# Patient Record
Sex: Male | Born: 2000 | Race: White | Hispanic: No | Marital: Single | Smoking: Never smoker
Health system: Southern US, Community
[De-identification: ages and names within clinical notes are randomized; demographics above are authoritative.]

## PROBLEM LIST (undated history)

## (undated) DIAGNOSIS — T753XXA Motion sickness, initial encounter: Secondary | ICD-10-CM

## (undated) DIAGNOSIS — J45909 Unspecified asthma, uncomplicated: Secondary | ICD-10-CM

## (undated) DIAGNOSIS — R011 Cardiac murmur, unspecified: Secondary | ICD-10-CM

## (undated) HISTORY — DX: Unspecified asthma, uncomplicated: J45.909

## (undated) HISTORY — PX: NO PAST SURGERIES: SHX2092

---
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
## 2019-01-04 ENCOUNTER — Other Ambulatory Visit: Payer: Self-pay

## 2019-01-04 ENCOUNTER — Emergency Department: Payer: No Typology Code available for payment source | Attending: Emergency Medicine

## 2019-01-04 ENCOUNTER — Emergency Department
Admission: EM | Admit: 2019-01-04 | Discharge: 2019-01-05 | Disposition: A | Discharge status: Home / Self Care | Payer: No Typology Code available for payment source | Attending: Emergency Medicine | Admitting: Emergency Medicine

## 2019-01-04 DIAGNOSIS — S0993XA Unspecified injury of face, initial encounter: Secondary | ICD-10-CM | POA: Diagnosis present

## 2019-01-04 DIAGNOSIS — Y9259 Other trade areas as the place of occurrence of the external cause: Secondary | ICD-10-CM | POA: Diagnosis not present

## 2019-01-04 DIAGNOSIS — S0033XA Contusion of nose, initial encounter: Secondary | ICD-10-CM | POA: Diagnosis not present

## 2019-01-04 DIAGNOSIS — Y990 Civilian activity done for income or pay: Secondary | ICD-10-CM | POA: Diagnosis not present

## 2019-01-04 DIAGNOSIS — J45909 Unspecified asthma, uncomplicated: Secondary | ICD-10-CM | POA: Insufficient documentation

## 2019-01-04 DIAGNOSIS — Y9389 Activity, other specified: Secondary | ICD-10-CM | POA: Diagnosis not present

## 2019-01-04 DIAGNOSIS — W208XXA Other cause of strike by thrown, projected or falling object, initial encounter: Secondary | ICD-10-CM | POA: Insufficient documentation

## 2019-01-04 NOTE — Discharge Instructions (Signed)
Follow-up with Dr. Richardson Landry who is the ENT specialist on call today if you continue to have problems.  Ice to your nose when possible to help reduce swelling.  You may take Tylenol or ibuprofen as needed for pain.  X-rays today did not show a nasal bone fracture.

## 2019-01-04 NOTE — ED Provider Notes (Signed)
[facility] Emergency Department Provider Note  ____________________________________________   First MD Initiated Contact with Patient 01/04/19 1041     (approximate)  I have reviewed the triage vital signs and the nursing notes.   HISTORY  Chief Complaint Facial Injury   HPI Derek Ruiz is a 18 y.o. male Derek Ruiz to the ED with complaint of nasal bone pain.  Patient states he was unloading a truck yesterday when a box fell and hit him in the nose.  Patient states that he did have a nosebleed that was controlled.  Patient states he went to urgent care for concerns of a nasal fracture and was sent to the emergency department.  He denies any head injury, loss of consciousness, dizziness or nausea or vomiting.  He rates his pain as a 4 out of 10.       Past Medical History:  Diagnosis Date  . Asthma     Patient Active Problem List   Diagnosis Date Noted  . Hyperlipidemia 08/10/2016  . 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 Dermatitis, eczematoid 05/30/2015  . Obesity 05/30/2015  . Allergic rhinitis 08/23/2006    Past Surgical History:  Procedure Laterality Date  . NO PAST SURGERIES      Prior to Admission medications   Medication Sig Start Date End Date Taking? Authorizing Provider  ADVAIR DISKUS 250-50 MCG/DOSE AEPB INHALE 1 PUFF 2 TIMES DAILY 04/07/16  Yes [provider]  albuterol (PROVENTIL HFA;VENTOLIN HFA) 108 (90 Base) MCG/ACT inhaler Inhale 2 puffs into the lungs every 6 (six) hours as needed for wheezing or shortness of breath. 04/21/16  Yes Chauvin, Molly Maduro, PA  [employer] NASAL DECONGESTANT PE 10 MG TABS tablet TAKE 1 TABLET BY MOUTH EVERY 6 HOURS FOR 5 DAYS 04/07/16  Yes [provider]  Fluticasone Propionate, Inhal, 100 MCG/BLIST AEPB One inhalation twice daily 06/05/15  Yes Chauvin, Molly Maduro, PA  naproxen (NAPROSYN) 500 MG tablet Take 500 mg by mouth 2 (two) times daily. 04/07/16   [provider]     Allergies Patient has no known allergies.  Family History  Problem Relation Age of Onset  . Diabetes Mother   . Congestive Heart Failure Father   . Epilepsy Sister     Social History Social History   Tobacco Use  . Smoking status: Never Smoker  . Smokeless tobacco: Never Used  Substance Use Topics  . Alcohol use: No    Alcohol/week: 0.0 standard drinks  . Drug use: No    Review of Systems Constitutional: No fever/chills Eyes: No visual changes. ENT: Positive nasal bone pain with nosebleed. Cardiovascular: Denies chest pain. Respiratory: Denies shortness of breath. Gastrointestinal:  No nausea, no vomiting.  Musculoskeletal: Negative for back pain. Skin: Negative for rash. Neurological: Negative for headaches ____________________________________________   PHYSICAL EXAM:  VITAL SIGNS: ED Triage Vitals  Enc Vitals Group     BP 01/04/19 0954 136/72     Pulse Rate 01/04/19 0954 81     Resp 01/04/19 0954 17     Temp 01/04/19 0954 98 F (36.7 C)     Temp Source 01/04/19 0954 Oral     SpO2 01/04/19 0954 100 %     Weight 01/04/19 0955 240 lb (108.9 kg)     Height 01/04/19 0955 5\' 7"  (1.702 m)     Head Circumference --      Peak Flow --      Pain Score 01/04/19 0954 4     Pain Loc --  Pain Edu? --      Excl. in [place]? --    Constitutional: Alert and oriented. Well appearing and in no acute distress. Eyes: Conjunctivae are normal. PERRL. EOMI. Head: Atraumatic. Nose: No congestion/rhinnorhea.  No active bleeding noted.  There is some swelling of the distal nasal soft tissue.  Moderate tenderness to palpation.  Skin is intact. Neck: No stridor.  Nontender cervical spine to palpation posteriorly. Cardiovascular: Normal rate, regular rhythm. Grossly normal heart sounds.  Good peripheral circulation. Respiratory: Normal respiratory effort.  No retractions. Lungs CTAB. Musculoskeletal: Moves upper and lower extremities with any difficulty.  Normal gait was  noted. Neurologic:  Normal speech and language. No gross focal neurologic deficits are appreciated. No gait instability. Skin:  Skin is warm, dry and intact.  No discoloration noted, nose. Psychiatric: Mood and affect are normal. Speech and behavior are normal.  ____________________________________________   LABS (all labs ordered are listed, but only abnormal results are displayed)  Labs Reviewed - No data to display  RADIOLOGY  Official radiology report(s): DG Nasal Bones  Result Date: 01/04/2019 CLINICAL DATA:  Posttraumatic nose pain related injury last night EXAM: NASAL BONES - 3+ VIEW COMPARISON:  None. FINDINGS: There is no evidence of fracture or other bone abnormality. The nasal septum is midline. The paranasal sinuses are symmetrically aerated. IMPRESSION: Negative. Electronically Signed   By: Monte Fantasia M.D.   On: 01/04/2019 11:20    ____________________________________________   PROCEDURES  Procedure(s) performed (including Critical Care):  Procedures   ____________________________________________   INITIAL IMPRESSION / ASSESSMENT AND PLAN / ED COURSE  As part of my medical decision making, I reviewed the following data within the electronic MEDICAL RECORD NUMBER Notes from prior ED visits and [place] Controlled Substance Database  18 year old male presents to the ED with a Workmen's Comp. injury.  Patient states that a box fell while he was unloading a truck yesterday.  He denies any LOC, dizziness or visual changes.  Patient states that he had a nosebleed that lasted a short period of time.  X-rays were negative for nasal fracture.  Patient was instructed to use ice to reduce the swelling and if any continued problems he is to follow-up with Dr. Richardson Landry who is the ENT on call today.  Patient will take ibuprofen or Tylenol as needed for pain.  ____________________________________________   FINAL CLINICAL IMPRESSION(S) / ED DIAGNOSES  Final diagnoses:  Contusion  of nose, initial encounter     ED Discharge Orders    None       Note:  This document was prepared using Dragon voice recognition software and may include unintentional dictation errors.    Johnn Hai, PA-C 01/04/19 1256    Carrie Mew, MD 01/04/19 [id number]

## 2019-01-04 NOTE — ED Notes (Signed)
Says last night at 8pm he was working inside a [employer].  Says he was getting box off top shelf about 60 lb.  It fell and hit him in face.  No loc, but hisnose bled.  When it stopped he continued working.  Today it is more swollen and painful.

## 2019-01-04 NOTE — ED Triage Notes (Signed)
Pt works at [employer] and was unloading a truck and a box fell and hit him on the nose and the pt was sent from urgent care for concern of a nasal fx, no noted deformity on arrival

## 2019-01-19 ENCOUNTER — Other Ambulatory Visit: Payer: Self-pay

## 2019-01-19 ENCOUNTER — Encounter: Payer: Self-pay | Admitting: Otolaryngology

## 2019-01-20 ENCOUNTER — Other Ambulatory Visit
Admission: RE | Admit: 2019-01-20 | Discharge: 2019-01-20 | Disposition: A | Discharge status: Home / Self Care | Payer: HRSA Program | Source: Ambulatory Visit | Attending: Otolaryngology | Admitting: Otolaryngology

## 2019-01-20 DIAGNOSIS — Z20822 Contact with and (suspected) exposure to covid-19: Secondary | ICD-10-CM | POA: Diagnosis not present

## 2019-01-20 DIAGNOSIS — Z01812 Encounter for preprocedural laboratory examination: Secondary | ICD-10-CM | POA: Insufficient documentation

## 2019-01-20 NOTE — Anesthesia Preprocedure Evaluation (Addendum)
Anesthesia Evaluation  Patient identified by MRN, date of birth, ID band Patient awake    Reviewed: Allergy & Precautions, NPO status , Patient's Chart, lab work & pertinent test results  History of Anesthesia Complications Negative for: history of anesthetic complications  Airway Mallampati: III  TM Distance: >3 FB Neck ROM: Full    Dental   Pulmonary asthma ,    breath sounds clear to auscultation       Cardiovascular (-) angina(-) DOE  Rhythm:Regular Rate:Normal     Neuro/Psych  Motion sickness    GI/Hepatic neg GERD  ,  Endo/Other    Renal/GU      Musculoskeletal   Abdominal (+) + obese (BMI 48),   Peds  Hematology   Anesthesia Other Findings   Reproductive/Obstetrics                            Anesthesia Physical Anesthesia Plan  ASA: III  Anesthesia Plan: General   Post-op Pain Management:    Induction: Intravenous  PONV Risk Score and Plan: 2 and Ondansetron, Dexamethasone, Scopolamine patch - Pre-op, Treatment may vary due to age or medical condition and Midazolam  Airway Management Planned: Oral ETT  Additional Equipment:   Intra-op Plan:   Post-operative Plan: Extubation in OR  Informed Consent: I have reviewed the patients History and Physical, chart, labs and discussed the procedure including the risks, benefits and alternatives for the proposed anesthesia with the patient or authorized representative who has indicated his/her understanding and acceptance.       Plan Discussed with: CRNA and Anesthesiologist  Anesthesia Plan Comments:         Anesthesia Quick Evaluation

## 2019-01-21 LAB — SARS CORONAVIRUS 2 (TAT 6-24 HRS): SARS Coronavirus 2: NEGATIVE

## 2019-01-24 ENCOUNTER — Ambulatory Visit
Admission: RE | Admit: 2019-01-24 | Discharge: 2019-01-24 | Disposition: A | Discharge status: Home / Self Care | Payer: No Typology Code available for payment source | Attending: Otolaryngology | Admitting: Otolaryngology

## 2019-01-24 ENCOUNTER — Ambulatory Visit: Payer: No Typology Code available for payment source | Admitting: Anesthesiology

## 2019-01-24 ENCOUNTER — Encounter: Payer: Self-pay | Admitting: Otolaryngology

## 2019-01-24 ENCOUNTER — Encounter
Admission: RE | Disposition: A | Discharge status: Home / Self Care | Payer: Self-pay | Source: Home / Self Care | Attending: Otolaryngology

## 2019-01-24 ENCOUNTER — Other Ambulatory Visit: Payer: Self-pay

## 2019-01-24 DIAGNOSIS — Z79899 Other long term (current) drug therapy: Secondary | ICD-10-CM | POA: Insufficient documentation

## 2019-01-24 DIAGNOSIS — Z68.41 Body mass index (BMI) pediatric, greater than or equal to 95th percentile for age: Secondary | ICD-10-CM | POA: Diagnosis not present

## 2019-01-24 DIAGNOSIS — E669 Obesity, unspecified: Secondary | ICD-10-CM | POA: Diagnosis not present

## 2019-01-24 DIAGNOSIS — J342 Deviated nasal septum: Secondary | ICD-10-CM | POA: Diagnosis not present

## 2019-01-24 DIAGNOSIS — J3489 Other specified disorders of nose and nasal sinuses: Secondary | ICD-10-CM | POA: Insufficient documentation

## 2019-01-24 HISTORY — DX: Cardiac murmur, unspecified: R01.1

## 2019-01-24 HISTORY — PX: SEPTOPLASTY: SHX2393

## 2019-01-24 HISTORY — DX: Motion sickness, initial encounter: T75.3XXA

## 2019-01-24 SURGERY — SEPTOPLASTY, NOSE
Anesthesia: General | Site: Nose

## 2019-01-24 MED ORDER — MEPERIDINE HCL 25 MG/ML IJ SOLN: 6.2500 mg | INTRAMUSCULAR | Status: DC | PRN

## 2019-01-24 MED ORDER — SUCCINYLCHOLINE CHLORIDE 20 MG/ML IJ SOLN
INTRAMUSCULAR | Status: DC | PRN
  Administered 2019-01-24: 160 mg via INTRAVENOUS

## 2019-01-24 MED ORDER — FENTANYL CITRATE (PF) 100 MCG/2ML IJ SOLN
INTRAMUSCULAR | Status: DC | PRN
  Administered 2019-01-24: 50 ug via INTRAVENOUS
  Administered 2019-01-24: 100 ug via INTRAVENOUS

## 2019-01-24 MED ORDER — GLYCOPYRROLATE 0.2 MG/ML IJ SOLN
INTRAMUSCULAR | Status: DC | PRN
  Administered 2019-01-24: .1 mg via INTRAVENOUS

## 2019-01-24 MED ORDER — ONDANSETRON HCL 4 MG/2ML IJ SOLN
INTRAMUSCULAR | Status: DC | PRN
  Administered 2019-01-24: 4 mg via INTRAVENOUS

## 2019-01-24 MED ORDER — DEXAMETHASONE SODIUM PHOSPHATE 4 MG/ML IJ SOLN
INTRAMUSCULAR | Status: DC | PRN
  Administered 2019-01-24: 4 mg via INTRAVENOUS

## 2019-01-24 MED ORDER — LIDOCAINE-EPINEPHRINE (PF) 1 %-1:200000 IJ SOLN
INTRAMUSCULAR | Status: DC | PRN
  Administered 2019-01-24: 9 mL

## 2019-01-24 MED ORDER — DEXMEDETOMIDINE HCL 200 MCG/2ML IV SOLN
INTRAVENOUS | Status: DC | PRN
  Administered 2019-01-24: 15 ug via INTRAVENOUS

## 2019-01-24 MED ORDER — PROPOFOL 10 MG/ML IV BOLUS
INTRAVENOUS | Status: DC | PRN
  Administered 2019-01-24: 200 mg via INTRAVENOUS

## 2019-01-24 MED ORDER — MIDAZOLAM HCL 5 MG/5ML IJ SOLN
INTRAMUSCULAR | Status: DC | PRN
  Administered 2019-01-24: 2 mg via INTRAVENOUS

## 2019-01-24 MED ORDER — SCOPOLAMINE 1 MG/3DAYS TD PT72
1.0000 | MEDICATED_PATCH | TRANSDERMAL | Status: DC
  Administered 2019-01-24: 1.5 mg via TRANSDERMAL

## 2019-01-24 MED ORDER — OXYCODONE HCL 5 MG/5ML PO SOLN
5.0000 mg | Freq: Once | ORAL | Status: AC | PRN
  Administered 2019-01-24: 12:00:00 5 mg via ORAL

## 2019-01-24 MED ORDER — HYDROMORPHONE HCL 1 MG/ML IJ SOLN: 0.2500 mg | INTRAMUSCULAR | Status: DC | PRN

## 2019-01-24 MED ORDER — CEPHALEXIN 500 MG PO CAPS: 500.0000 mg | ORAL_CAPSULE | Freq: Two times a day (BID) | ORAL | 0 refills | Status: AC

## 2019-01-24 MED ORDER — OXYCODONE HCL 5 MG PO TABS: 5.0000 mg | ORAL_TABLET | Freq: Once | ORAL | Status: AC | PRN

## 2019-01-24 MED ORDER — LIDOCAINE HCL (CARDIAC) PF 100 MG/5ML IV SOSY
PREFILLED_SYRINGE | INTRAVENOUS | Status: DC | PRN
  Administered 2019-01-24: 80 mg via INTRAVENOUS

## 2019-01-24 MED ORDER — HYDROCODONE-ACETAMINOPHEN 5-325 MG PO TABS: 1.0000 | ORAL_TABLET | Freq: Four times a day (QID) | ORAL | 0 refills | Status: AC | PRN

## 2019-01-24 MED ORDER — ACETAMINOPHEN 10 MG/ML IV SOLN
1000.0000 mg | Freq: Once | INTRAVENOUS | Status: AC
  Administered 2019-01-24: 1000 mg via INTRAVENOUS

## 2019-01-24 MED ORDER — LACTATED RINGERS IV SOLN
100.0000 mL/h | INTRAVENOUS | Status: DC
  Administered 2019-01-24: 100 mL/h via INTRAVENOUS

## 2019-01-24 MED ORDER — PROMETHAZINE HCL 25 MG/ML IJ SOLN: 6.2500 mg | INTRAMUSCULAR | Status: DC | PRN

## 2019-01-24 MED ORDER — OXYMETAZOLINE HCL 0.05 % NA SOLN
NASAL | Status: DC | PRN
  Administered 2019-01-24: 1 via TOPICAL

## 2019-01-24 SURGICAL SUPPLY — 18 items
CANISTER SUCT 1200ML W/VALVE (MISCELLANEOUS) ×3 IMPLANT
COAG SUCT 10F 3.5MM HAND CTRL (MISCELLANEOUS) ×3 IMPLANT
COVER WAND RF STERILE (DRAPES) ×3 IMPLANT
ELECT REM PT RETURN 9FT ADLT (ELECTROSURGICAL) ×3
ELECTRODE REM PT RTRN 9FT ADLT (ELECTROSURGICAL) ×1 IMPLANT
GLOVE BIO SURGEON STRL SZ7.5 (GLOVE) ×6 IMPLANT
GOWN STRL REUS W/ TWL LRG LVL3 (GOWN DISPOSABLE) ×1 IMPLANT
GOWN STRL REUS W/TWL LRG LVL3 (GOWN DISPOSABLE) ×2
KIT TURNOVER KIT A (KITS) ×3 IMPLANT
NS IRRIG 500ML POUR BTL (IV SOLUTION) ×3 IMPLANT
PACK HEAD/NECK (MISCELLANEOUS) ×3 IMPLANT
SPLINT NASAL SEPTAL PRE-CUT (MISCELLANEOUS) ×3 IMPLANT
SPONGE NEURO XRAY DETECT 1X3 (DISPOSABLE) ×3 IMPLANT
SUT CHROMIC 4 0 RB 1X27 (SUTURE) ×3 IMPLANT
SUT ETHILON 4-0 (SUTURE) ×2
SUT ETHILON 4-0 FS2 18XMFL BLK (SUTURE) ×1
SUT PLAIN GUT 4-0 (SUTURE) ×2 IMPLANT
SUTURE ETHLN 4-0 FS2 18XMF BLK (SUTURE) ×1 IMPLANT

## 2019-01-24 NOTE — Op Note (Signed)
01/24/2019  11:51 AM    Derek Ruiz  [id number]   Pre-Op Diagnosis:  Nasal obstruction with septal fracture and deviation   Post-op Diagnosis: Same  Procedure: 1) Nasal Septoplasty  Surgeon:  Sandi Mealy  Anesthesia:  General endotracheal  EBL:  Less than 20cc  Complications:  None  Findings: Septal fractures anteriorly with left septal deviation.  Procedure: The patient was taken to the Operating Room and placed in the supine position.  After induction of general endotracheal anesthesia, the table was turned 90 degrees. A time-out was issued to confirm the site and procedure. The nasal septum and inferior turbinates where then injected with 1% lidocaine with epiniephrine, 1:200,000. The nose was decongested with Afrin soaked pledgets. The patient was then prepped and draped in the usual sterile fashion. Beginning on the left hand side a hemitransfixion incision was then created on the leading edge of the septum on the left.  A subperichondrial plane was elevated posteriorly on the left and taken back to the perpendicular plate of the ethmoid. A flap was then raised on the right side of the cartilage where a small amount of hematoma was evacuated. The cartilaginous septum was fractured in 3 places, with a smaller segment at the tip and larger fractured segment just behind that. The tip segment had collapsed into the left nasal cavity creating the main obstruction. These fragments were freed up and reduced. A small pocket was created in the columella to help secure the tip segment in the midline. There was no significant deviation of the bony septum.   The septum was then replaced in the midline, and carefully placed 4-0 plain gut sutures used to help secure the fragments in the septal pocket in good anatomic position, running a quilting stitch for this purpose. Reinspection through each nostril showed excellent reduction of the septal deformity. The septal incision was closed  with 4-0 chromic gut suture.   With the septoplasty completed and no active bleeding, nasal septal splints were placed within each nostril and affixed to the septum using a 4-0 nylon suture.  The patient was then returned to the anesthesiologist for awakening, and was taken to the Recovery Room in stable condition.  Disposition:   PACU to home  Plan: Soft, bland diet and push fluids. Take pain medications and antibiotics as prescribed. No strenuous activity for 2 weeks. Follow-up in 1 week for splint removal.  Sandi Mealy 01/24/2019 11:51 AM

## 2019-01-24 NOTE — Anesthesia Procedure Notes (Signed)
Procedure Name: Intubation Date/Time: 01/24/2019 10:39 AM Performed by: Cameron Ali, CRNA Pre-anesthesia Checklist: Patient identified, Emergency Drugs available, Suction available, Patient being monitored and Timeout performed Patient Re-evaluated:Patient Re-evaluated prior to induction Oxygen Delivery Method: Circle system utilized Preoxygenation: Pre-oxygenation with 100% oxygen Induction Type: IV induction Ventilation: Mask ventilation without difficulty Laryngoscope Size: Mac and 3 Grade View: Grade I Tube type: Oral Rae Tube size: 8.0 mm Number of attempts: 1 Placement Confirmation: ETT inserted through vocal cords under direct vision,  positive ETCO2 and breath sounds checked- equal and bilateral Tube secured with: Tape Dental Injury: Teeth and Oropharynx as per pre-operative assessment

## 2019-01-24 NOTE — Anesthesia Postprocedure Evaluation (Signed)
Anesthesia Post Note  Patient: Derek Ruiz  Procedure(s) Performed: SEPTOPLASTY (N/A Nose)     Patient location during evaluation: PACU Anesthesia Type: General Level of consciousness: awake and alert Pain management: pain level controlled Vital Signs Assessment: post-procedure vital signs reviewed and stable Respiratory status: spontaneous breathing, nonlabored ventilation, respiratory function stable and patient connected to nasal cannula oxygen Cardiovascular status: blood pressure returned to baseline and stable Postop Assessment: no apparent nausea or vomiting Anesthetic complications: no    Samya Siciliano A  Marcelyn Ruppe

## 2019-01-24 NOTE — Discharge Instructions (Signed)
Scopolamine skin patches What is this medicine? SCOPOLAMINE (skoe POL a meen) is used to prevent nausea and vomiting caused by motion sickness, anesthesia and surgery. This medicine may be used for other purposes; ask your health care provider or pharmacist if you have questions. COMMON BRAND NAME(S): Transderm Scop What should I tell my health care provider before I take this medicine? They need to know if you have any of these conditions:  are scheduled to have a gastric secretion test  glaucoma  heart disease  kidney disease  liver disease  lung or breathing disease, like asthma  mental illness  prostate disease  seizures  stomach or intestine problems  trouble passing urine  an unusual or allergic reaction to scopolamine, atropine, other medicines, foods, dyes, or preservatives  pregnant or trying to get pregnant  breast-feeding How should I use this medicine? This medicine is for external use only. Follow the directions on the prescription label. Wear only 1 patch at a time. Choose an area behind the ear, that is clean, dry, hairless and free from any cuts or irritation. Wipe the area with a clean dry tissue. Peel off the plastic backing of the skin patch, trying not to touch the adhesive side with your hands. Do not cut the patches. Firmly apply to the area you have chosen, with the metallic side of the patch to the skin and the tan-colored side showing. Once firmly in place, wash your hands well with soap and water. Do not get this medicine into your eyes. After removing the patch, wash your hands and the area behind your ear thoroughly with soap and water. The patch will still contain some medicine after use. To avoid accidental contact or ingestion by children or pets, fold the used patch in half with the sticky side together and throw away in the trash out of the reach of children and pets. If you need to use a second patch after you remove the first, place it behind  the other ear. A special MedGuide will be given to you by the pharmacist with each prescription and refill. Be sure to read this information carefully each time. Talk to your pediatrician regarding the use of this medicine in children. Special care may be needed. Overdosage: If you think you have taken too much of this medicine contact a poison control center or emergency room at once. NOTE: This medicine is only for you. Do not share this medicine with others. What if I miss a dose? This does not apply. This medicine is not for regular use. What may interact with this medicine?  alcohol  antihistamines for allergy cough and cold  atropine  certain medicines for anxiety or sleep  certain medicines for bladder problems like oxybutynin, tolterodine  certain medicines for depression like amitriptyline, fluoxetine, sertraline  certain medicines for stomach problems like dicyclomine, hyoscyamine  certain medicines for Parkinson's disease like benztropine, trihexyphenidyl  certain medicines for seizures like phenobarbital, primidone  general anesthetics like halothane, isoflurane, methoxyflurane, propofol  ipratropium  local anesthetics like lidocaine, pramoxine, tetracaine  medicines that relax muscles for surgery  phenothiazines like chlorpromazine, mesoridazine, prochlorperazine, thioridazine  narcotic medicines for pain  other belladonna alkaloids This list may not describe all possible interactions. Give your health care provider a list of all the medicines, herbs, non-prescription drugs, or dietary supplements you use. Also tell them if you smoke, drink alcohol, or use illegal drugs. Some items may interact with your medicine. What should I watch for while using  this medicine? Limit contact with water while swimming and bathing because the patch may fall off. If the patch falls off, throw it away and put a new one behind the other ear. You may get drowsy or dizzy. Do not  drive, use machinery, or do anything that needs mental alertness until you know how this medicine affects you. Do not stand or sit up quickly, especially if you are an older patient. This reduces the risk of dizzy or fainting spells. Alcohol may interfere with the effect of this medicine. Avoid alcoholic drinks. Your mouth may get dry. Chewing sugarless gum or sucking hard candy, and drinking plenty of water may help. Contact your healthcare professional if the problem does not go away or is severe. This medicine may cause dry eyes and blurred vision. If you wear contact lenses, you may feel some discomfort. Lubricating drops may help. See your healthcare professional if the problem does not go away or is severe. If you are going to need surgery, an MRI, CT scan, or other procedure, tell your healthcare professional that you are using this medicine. You may need to remove the patch before the procedure. What side effects may I notice from receiving this medicine? Side effects that you should report to your doctor or health care professional as soon as possible:  allergic reactions like skin rash, itching or hives; swelling of the face, lips, or tongue  blurred vision  changes in vision  confusion  dizziness  eye pain  fast, irregular heartbeat  hallucinations, loss of contact with reality  nausea, vomiting  pain or trouble passing urine  restlessness  seizures  skin irritation  stomach pain Side effects that usually do not require medical attention (report to your doctor or health care professional if they continue or are bothersome):  drowsiness  dry mouth  headache  sore throat This list may not describe all possible side effects. Call your doctor for medical advice about side effects. You may report side effects to FDA at 1-800-FDA-1088. Where should I keep my medicine? Keep out of the reach of children. Store at room temperature between 20 and 25 degrees C (68 and 77  degrees F). Keep this medicine in the foil package until ready to use. Throw away any unused medicine after the expiration date. NOTE: This sheet is a summary. It may not cover all possible information. If you have questions about this medicine, talk to your doctor, pharmacist, or health care provider.  2020 Elsevier/Gold Standard (2017-03-12 16:14:46) [facility]  What is Functional Endoscopic Sinus Surgery?  The Surgery involves making the natural openings of the sinuses larger by removing the bony partitions that separate the sinuses from the nasal cavity.  The natural sinus lining is preserved as much as possible to allow the sinuses to resume normal function after the surgery.  In some patients nasal polyps (excessively swollen lining of the sinuses) may be removed to relieve obstruction of the sinus openings.  The surgery is performed through the nose using lighted scopes, which eliminates the need for incisions on the face.  A septoplasty is a different procedure which is sometimes performed with sinus surgery.  It involves straightening the boy partition that separates the two sides of your nose.  A crooked or deviated septum may need repair if is obstructing the sinuses or nasal airflow.  Turbinate reduction is also often performed during sinus surgery.  The  turbinates are bony proturberances from the side walls of the nose which swell and can obstruct the nose in patients with sinus and allergy problems.  Their size can be surgically reduced to help relieve nasal obstruction.  What Can Sinus Surgery Do For Me?  Sinus surgery can reduce the frequency of sinus infections requiring antibiotic treatment.  This can provide improvement in nasal congestion, post-nasal drainage, facial pressure and nasal obstruction.  Surgery will NOT prevent you from ever having an infection again, so it usually  only for patients who get infections 4 or more times yearly requiring antibiotics, or for infections that do not clear with antibiotics.  It will not cure nasal allergies, so patients with allergies may still require medication to treat their allergies after surgery. Surgery may improve headaches related to sinusitis, however, some people will continue to require medication to control sinus headaches related to allergies.  Surgery will do nothing for other forms of headache (migraine, tension or cluster).  What Are the Risks of Endoscopic Sinus Surgery?  Current techniques allow surgery to be performed safely with little risk, however, there are rare complications that patients should be aware of.  Because the sinuses are located around the eyes, there is risk of eye injury, including blindness, though again, this would be quite rare. This is usually a result of bleeding behind the eye during surgery, which puts the vision oat risk, though there are treatments to protect the vision and prevent permanent disrupted by surgery causing a leak of the spinal fluid that surrounds the brain.  More serious complications would include bleeding inside the brain cavity or damage to the brain.  Again, all of these complications are uncommon, and spinal fluid leaks can be safely managed surgically if they occur.  The most common complication of sinus surgery is bleeding from the nose, which may require packing or cauterization of the nose.  Continued sinus have polyps may experience recurrence of the polyps requiring revision surgery.  Alterations of sense of smell or injury to the tear ducts are also rare complications.   What is the Surgery Like, and what is the Recovery?  The Surgery usually takes a couple of hours to perform, and is usually performed under a general anesthetic (completely asleep).  Patients are usually discharged home after a couple of hours.  Sometimes during surgery it is necessary to pack the nose to  control bleeding, and the packing is left in place for 24 - 48 hours, and removed by your surgeon.  If a septoplasty was performed during the procedure, there is often a splint placed which must be removed after 5-7 days.   Discomfort: Pain is usually mild to moderate, and can be controlled by prescription pain medication or acetaminophen (Tylenol).  Aspirin, Ibuprofen (Advil, Motrin), or Naprosyn (Aleve) should be avoided, as they can cause increased bleeding.  Most patients feel sinus pressure like they have a bad head cold for several days.  Sleeping with your head elevated can help reduce swelling and facial pressure, as can ice packs over the face.  A humidifier may be helpful to keep the mucous and blood from drying in the nose.   Diet: There are no specific diet restrictions, however, you should generally start with clear liquids and a light diet of bland foods because the anesthetic can cause some nausea.  Advance your diet depending on how your stomach feels.  Taking your pain medication with food will often help reduce stomach upset which pain medications  can cause.  Nasal Saline Irrigation: It is important to remove blood clots and dried mucous from the nose as it is healing.  This is done by having you irrigate the nose at least 3 - 4 times daily with a salt water solution.  We recommend using NeilMed Sinus Rinse (available at the drug store).  Fill the squeeze bottle with the solution, bend over a sink, and insert the tip of the squeeze bottle into the nose  of an inch.  Point the tip of the squeeze bottle towards the inside corner of the eye on the same side your irrigating.  Squeeze the bottle and gently irrigate the nose.  If you bend forward as you do this, most of the fluid will flow back out of the nose, instead of down your throat.   The solution should be warm, near body temperature, when you irrigate.   Each time you irrigate, you should use a full squeeze bottle.   Note that if you are  instructed to use Nasal Steroid Sprays at any time after your surgery, irrigate with saline BEFORE using the steroid spray, so you do not wash it all out of the nose. Another product, Nasal Saline Gel (such as AYR Nasal Saline Gel) can be applied in each nostril 3 - 4 times daily to moisture the nose and reduce scabbing or crusting.  Bleeding:  Bloody drainage from the nose can be expected for several days, and patients are instructed to irrigate their nose frequently with salt water to help remove mucous and blood clots.  The drainage may be dark red or brown, though some fresh blood may be seen intermittently, especially after irrigation.  Do not blow you nose, as bleeding may occur. If you must sneeze, keep your mouth open to allow air to escape through your mouth.  If heavy bleeding occurs: Irrigate the nose with saline to rinse out clots, then spray the nose 3 - 4 times with Afrin Nasal Decongestant Spray.  The spray will constrict the blood vessels to slow bleeding.  Pinch the lower half of your nose shut to apply pressure, and lay down with your head elevated.  Ice packs over the nose may help as well. If bleeding persists despite these measures, you should notify your doctor.  Do not use the Afrin routinely to control nasal congestion after surgery, as it can result in worsening congestion and may affect healing.     Activity: Return to work varies among patients. Most patients will be out of work at least 5 - 7 days to recover.  Patient may return to work after they are off of narcotic pain medication, and feeling well enough to perform the functions of their job.  Patients must avoid heavy lifting (over 10 pounds) or strenuous physical for 2 weeks after surgery, so your employer may need to assign you to light duty, or keep you out of work longer if light duty is not possible.  NOTE: you should not drive, operate dangerous machinery, do any mentally demanding tasks or make any important legal or  financial decisions while on narcotic pain medication and recovering from the general anesthetic.    Call Your Doctor Immediately if You Have Any of the Following: 1. Bleeding that you cannot control with the above measures 2. Loss of vision, double vision, bulging of the eye or black eyes. 3. Fever over 101 degrees 4. Neck stiffness with severe headache, fever, nausea and change in mental state. You are always  encourage to call anytime with concerns, however, please call with requests for pain medication refills during office hours.  Office Endoscopy: During follow-up visits your doctor will remove any packing or splints that may have been placed and evaluate and clean your sinuses endoscopically.  Topical anesthetic will be used to make this as comfortable as possible, though you may want to take your pain medication prior to the visit.  How often this will need to be done varies from patient to patient.  After complete recovery from the surgery, you may need follow-up endoscopy from time to time, particularly if there is concern of recurrent infection or nasal polyps.    General Anesthesia, Adult, Care After This sheet gives you information about how to care for yourself after your procedure. Your health care provider may also give you more specific instructions. If you have problems or questions, contact your health care provider. What can I expect after the procedure? After the procedure, the following side effects are common:  Pain or discomfort at the IV site.  Nausea.  Vomiting.  Sore throat.  Trouble concentrating.  Feeling cold or chills.  Weak or tired.  Sleepiness and fatigue.  Soreness and body aches. These side effects can affect parts of the body that were not involved in surgery. Follow these instructions at home:  For at least 24 hours after the procedure:  Have a responsible adult stay with you. It is important to have someone help care for you until you are  awake and alert.  Rest as needed.  Do not: ? Participate in activities in which you could fall or become injured. ? Drive. ? Use heavy machinery. ? Drink alcohol. ? Take sleeping pills or medicines that cause drowsiness. ? Make important decisions or sign legal documents. ? Take care of children on your own. Eating and drinking  Follow any instructions from your health care provider about eating or drinking restrictions.  When you feel hungry, start by eating small amounts of foods that are soft and easy to digest (bland), such as toast. Gradually return to your regular diet.  Drink enough fluid to keep your urine pale yellow.  If you vomit, rehydrate by drinking water, juice, or clear broth. General instructions  If you have sleep apnea, surgery and certain medicines can increase your risk for breathing problems. Follow instructions from your health care provider about wearing your sleep device: ? Anytime you are sleeping, including during daytime naps. ? While taking prescription pain medicines, sleeping medicines, or medicines that make you drowsy.  Return to your normal activities as told by your health care provider. Ask your health care provider what activities are safe for you.  Take over-the-counter and prescription medicines only as told by your health care provider.  If you smoke, do not smoke without supervision.  Keep all follow-up visits as told by your health care provider. This is important. Contact a health care provider if:  You have nausea or vomiting that does not get better with medicine.  You cannot eat or drink without vomiting.  You have pain that does not get better with medicine.  You are unable to pass urine.  You develop a skin rash.  You have a fever.  You have redness around your IV site that gets worse. Get help right away if:  You have difficulty breathing.  You have chest pain.  You have blood in your urine or stool, or you vomit  blood. Summary  After the procedure, it  is common to have a sore throat or nausea. It is also common to feel tired.  Have a responsible adult stay with you for the first 24 hours after general anesthesia. It is important to have someone help care for you until you are awake and alert.  When you feel hungry, start by eating small amounts of foods that are soft and easy to digest (bland), such as toast. Gradually return to your regular diet.  Drink enough fluid to keep your urine pale yellow.  Return to your normal activities as told by your health care provider. Ask your health care provider what activities are safe for you. This information is not intended to replace advice given to you by your health care provider. Make sure you discuss any questions you have with your health care provider. Document Revised: 12/25/2016 Document Reviewed: 08/07/2016 Elsevier Patient Education  2020 [employer] Scopolamine skin patches What is this medicine? SCOPOLAMINE (skoe POL a meen) is used to prevent nausea and vomiting caused by motion sickness, anesthesia and surgery. This medicine may be used for other purposes; ask your health care provider or pharmacist if you have questions. COMMON BRAND NAME(S): Transderm Scop What should I tell my health care provider before I take this medicine? They need to know if you have any of these conditions:  are scheduled to have a gastric secretion test  glaucoma  heart disease  kidney disease  liver disease  lung or breathing disease, like asthma  mental illness  prostate disease  seizures  stomach or intestine problems  trouble passing urine  an unusual or allergic reaction to scopolamine, atropine, other medicines, foods, dyes, or preservatives  pregnant or trying to get pregnant  breast-feeding How should I use this medicine? This medicine is for external use only. Follow the directions on the prescription label. Wear only 1 patch at a  time. Choose an area behind the ear, that is clean, dry, hairless and free from any cuts or irritation. Wipe the area with a clean dry tissue. Peel off the plastic backing of the skin patch, trying not to touch the adhesive side with your hands. Do not cut the patches. Firmly apply to the area you have chosen, with the metallic side of the patch to the skin and the tan-colored side showing. Once firmly in place, wash your hands well with soap and water. Do not get this medicine into your eyes. After removing the patch, wash your hands and the area behind your ear thoroughly with soap and water. The patch will still contain some medicine after use. To avoid accidental contact or ingestion by children or pets, fold the used patch in half with the sticky side together and throw away in the trash out of the reach of children and pets. If you need to use a second patch after you remove the first, place it behind the other ear. A special MedGuide will be given to you by the pharmacist with each prescription and refill. Be sure to read this information carefully each time. Talk to your pediatrician regarding the use of this medicine in children. Special care may be needed. Overdosage: If you think you have taken too much of this medicine contact a poison control center or emergency room at once. NOTE: This medicine is only for you. Do not share this medicine with others. What if I miss a dose? This does not apply. This medicine is not for regular use. What may interact with this medicine?  alcohol  antihistamines for allergy cough and cold  atropine  certain medicines for anxiety or sleep  certain medicines for bladder problems like oxybutynin, tolterodine  certain medicines for depression like amitriptyline, fluoxetine, sertraline  certain medicines for stomach problems like dicyclomine, hyoscyamine  certain medicines for Parkinson's disease like benztropine, trihexyphenidyl  certain medicines  for seizures like phenobarbital, primidone  general anesthetics like halothane, isoflurane, methoxyflurane, propofol  ipratropium  local anesthetics like lidocaine, pramoxine, tetracaine  medicines that relax muscles for surgery  phenothiazines like chlorpromazine, mesoridazine, prochlorperazine, thioridazine  narcotic medicines for pain  other belladonna alkaloids This list may not describe all possible interactions. Give your health care provider a list of all the medicines, herbs, non-prescription drugs, or dietary supplements you use. Also tell them if you smoke, drink alcohol, or use illegal drugs. Some items may interact with your medicine. What should I watch for while using this medicine? Limit contact with water while swimming and bathing because the patch may fall off. If the patch falls off, throw it away and put a new one behind the other ear. You may get drowsy or dizzy. Do not drive, use machinery, or do anything that needs mental alertness until you know how this medicine affects you. Do not stand or sit up quickly, especially if you are an older patient. This reduces the risk of dizzy or fainting spells. Alcohol may interfere with the effect of this medicine. Avoid alcoholic drinks. Your mouth may get dry. Chewing sugarless gum or sucking hard candy, and drinking plenty of water may help. Contact your healthcare professional if the problem does not go away or is severe. This medicine may cause dry eyes and blurred vision. If you wear contact lenses, you may feel some discomfort. Lubricating drops may help. See your healthcare professional if the problem does not go away or is severe. If you are going to need surgery, an MRI, CT scan, or other procedure, tell your healthcare professional that you are using this medicine. You may need to remove the patch before the procedure. What side effects may I notice from receiving this medicine? Side effects that you should report to your  doctor or health care professional as soon as possible:  allergic reactions like skin rash, itching or hives; swelling of the face, lips, or tongue  blurred vision  changes in vision  confusion  dizziness  eye pain  fast, irregular heartbeat  hallucinations, loss of contact with reality  nausea, vomiting  pain or trouble passing urine  restlessness  seizures  skin irritation  stomach pain Side effects that usually do not require medical attention (report to your doctor or health care professional if they continue or are bothersome):  drowsiness  dry mouth  headache  sore throat This list may not describe all possible side effects. Call your doctor for medical advice about side effects. You may report side effects to FDA at 1-800-FDA-1088. Where should I keep my medicine? Keep out of the reach of children. Store at room temperature between 20 and 25 degrees C (68 and 77 degrees F). Keep this medicine in the foil package until ready to use. Throw away any unused medicine after the expiration date. NOTE: This sheet is a summary. It may not cover all possible information. If you have questions about this medicine, talk to your doctor, pharmacist, or health care provider.  2020 [employer] (2017-03-12 16:14:46)

## 2019-01-24 NOTE — H&P (Signed)
History and physical reviewed and will be scanned in later. No change in medical status reported by the patient or family, appears stable for surgery. All questions regarding the procedure answered, and patient (or family if a child) expressed understanding of the procedure. ? ?Derek Ruiz ?[email or web address]@ ?

## 2019-01-24 NOTE — Transfer of Care (Addendum)
Immediate Anesthesia Transfer of Care Note  Patient: Derek Ruiz  Procedure(s) Performed: SEPTOPLASTY (N/A Nose)  Patient Location: PACU  Anesthesia Type: General  Level of Consciousness: awake, alert  and patient cooperative  Airway and Oxygen Therapy: Patient Spontanous Breathing and Patient connected to supplemental oxygen  Post-op Assessment: Post-op Vital signs reviewed, Patient's Cardiovascular Status Stable, Respiratory Function Stable, Patent Airway and No signs of Nausea or vomiting  Post-op Vital Signs: Reviewed and stable  Complications: No apparent anesthesia complications

## 2019-01-25 ENCOUNTER — Encounter: Payer: Self-pay | Admitting: *Deleted

## 2020-01-17 DIAGNOSIS — Z20822 Contact with and (suspected) exposure to covid-19: Secondary | ICD-10-CM | POA: Diagnosis not present

## 2021-01-08 IMAGING — CR DG NASAL BONES 3+V
3 series · 3 of 3 positions shown · non-contrast
Comparison: None.

CLINICAL DATA: Posttraumatic nose pain related injury last night

EXAM:
NASAL BONES - 3+ VIEW

[nasal waters]
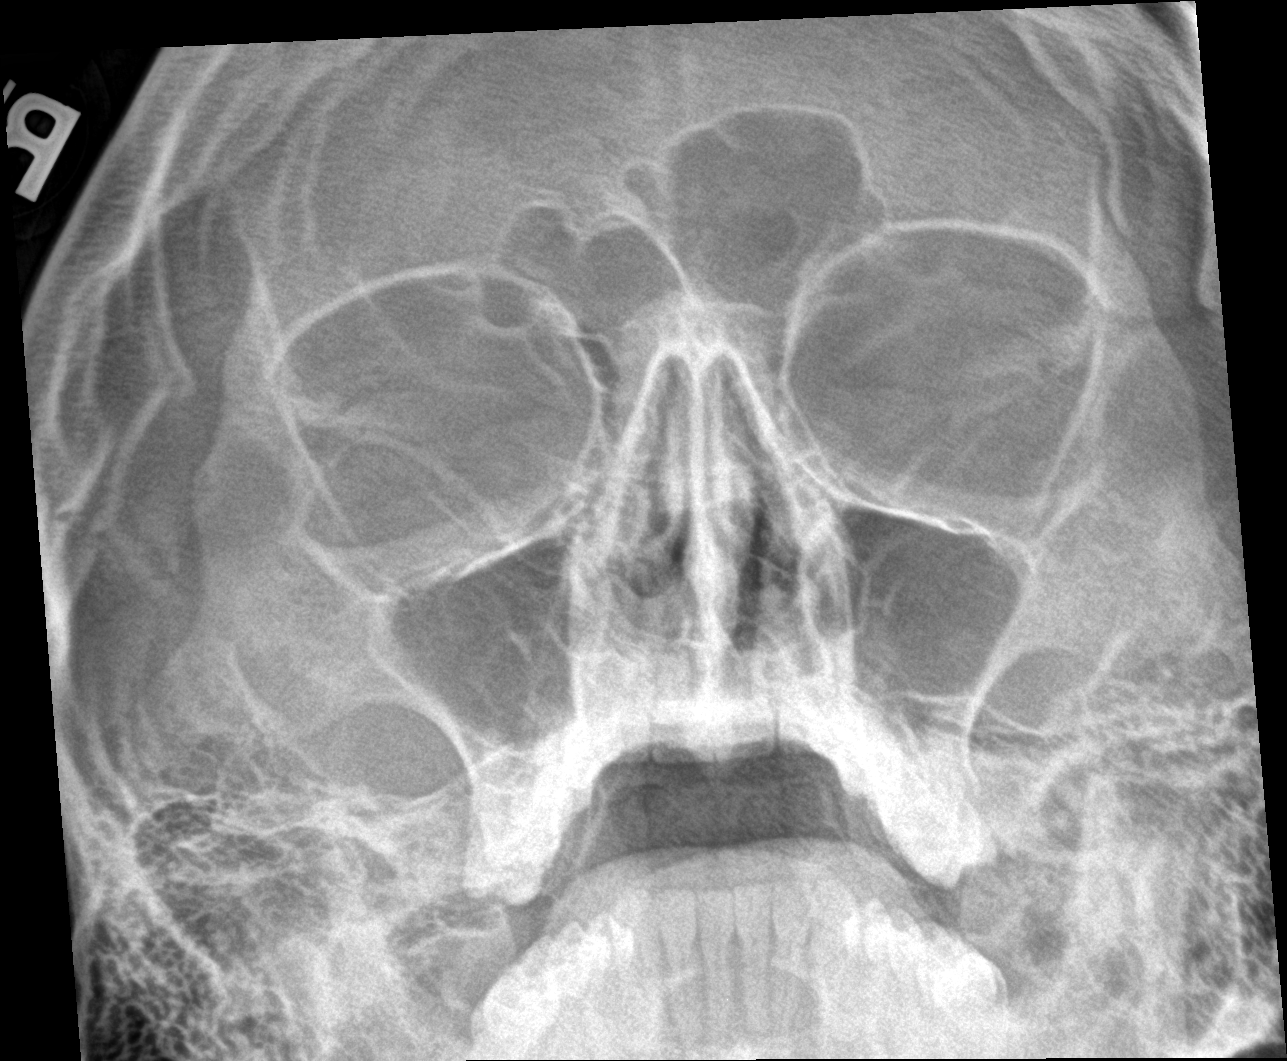

[nasal lat (1 of 2)]
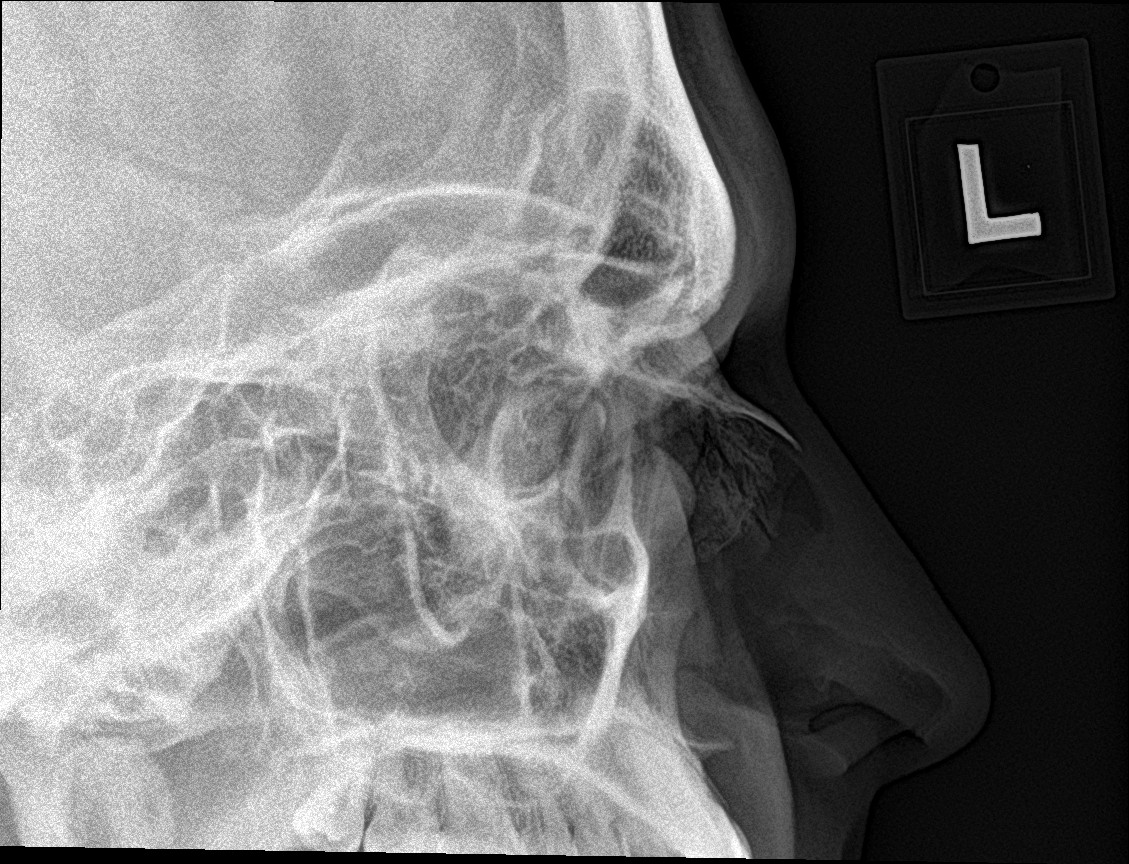

[nasal lat (2 of 2)]
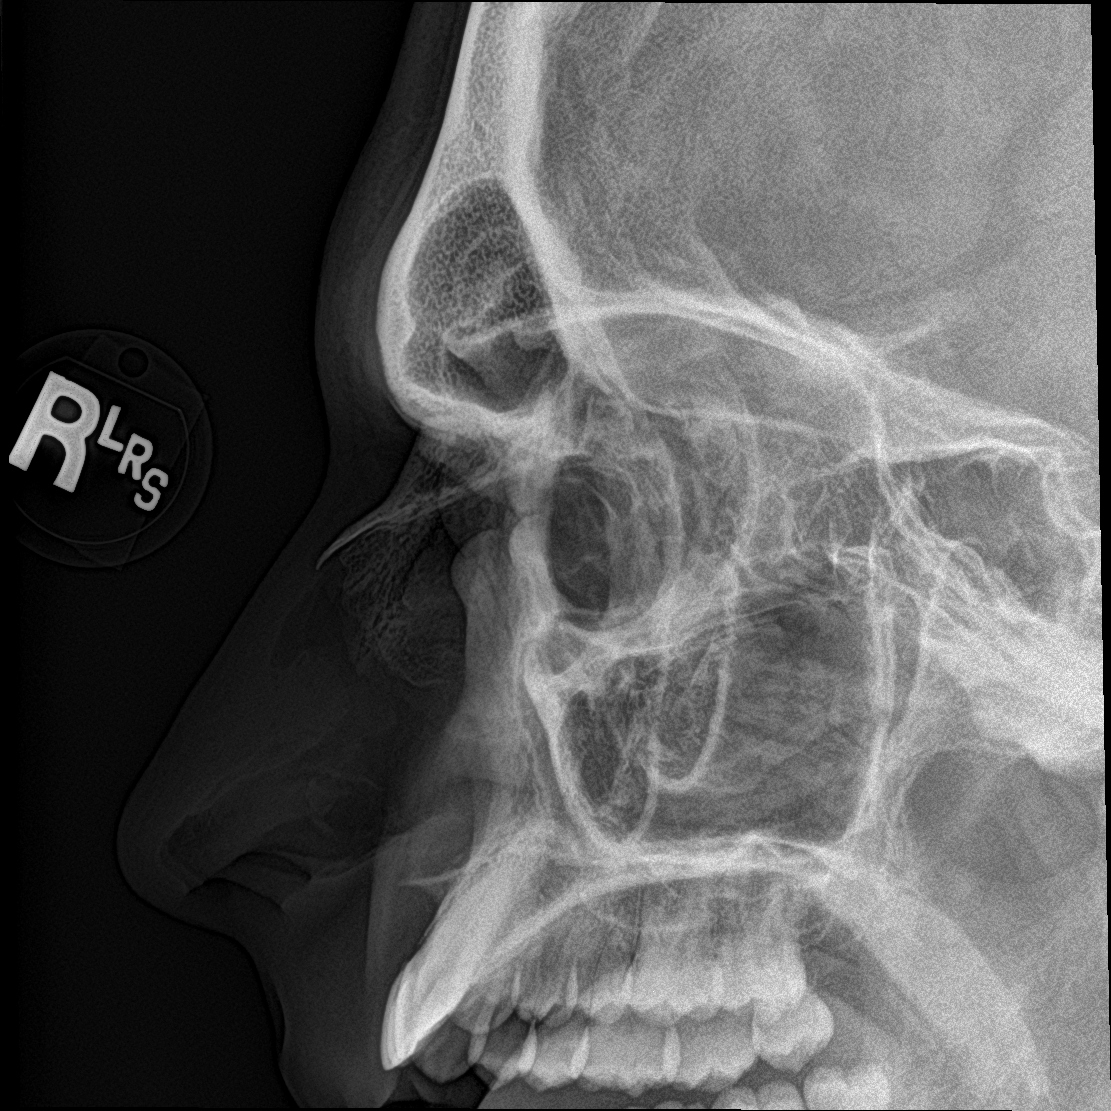

[3 of 3 positions shown; findings below may reference images not displayed]

FINDINGS: There is no evidence of fracture or other bone abnormality. The
nasal septum is midline. The paranasal sinuses are symmetrically
aerated.
IMPRESSION: Negative.
